# Patient Record
Sex: Female | Born: 1975 | Race: White | Hispanic: No | Marital: Married
Health system: Southern US, Community
[De-identification: ages and names within clinical notes are randomized; demographics above are authoritative.]

---

## 2009-02-18 ENCOUNTER — Ambulatory Visit: Payer: Self-pay | Admitting: Obstetrics & Gynecology

## 2009-02-21 ENCOUNTER — Inpatient Hospital Stay (HOSPITAL_COMMUNITY): Admission: AD | Admit: 2009-02-21 | Discharge: 2009-02-23 | Payer: Self-pay | Admitting: Obstetrics & Gynecology

## 2010-01-21 IMAGING — US US OB US >=[ID] SNGL FETUS
1 series · 17 of 28 positions shown · non-contrast
Comparison: none

REASON FOR EXAM: post dates      Fax Results [phone] While Pt is there
COMMENTS:

PROCEDURE:     US  - US OB GREATER/OR EQUAL TO 0HVTS  - February 18, 2009  [DATE]
RESULT:     Comparison: None
INDICATION: Post dates
TECHNIQUE: Multiple transabdominal gray-scale images of the pelvis
performed.

[Series 1: us ob us >=(id) sngl fetus · 17 of 40 slices shown]
[im 1/40]
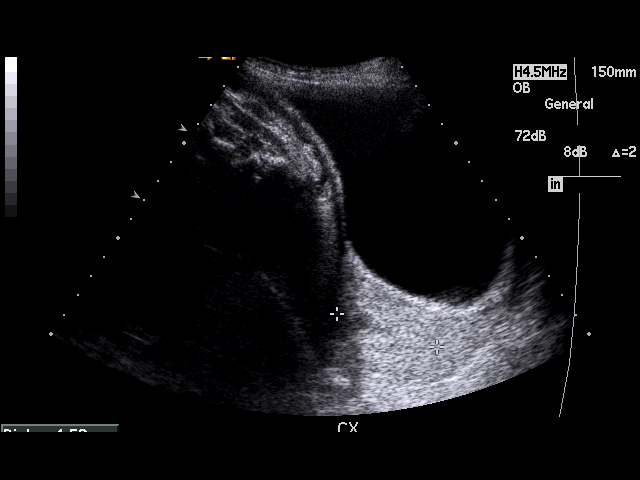
[im 3/40]
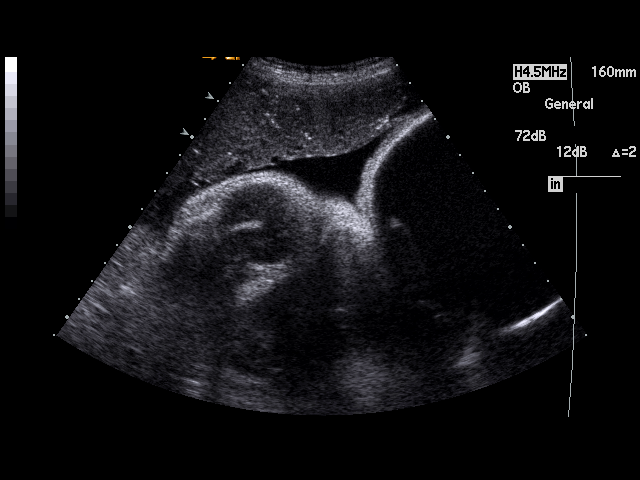
[im 6/40]
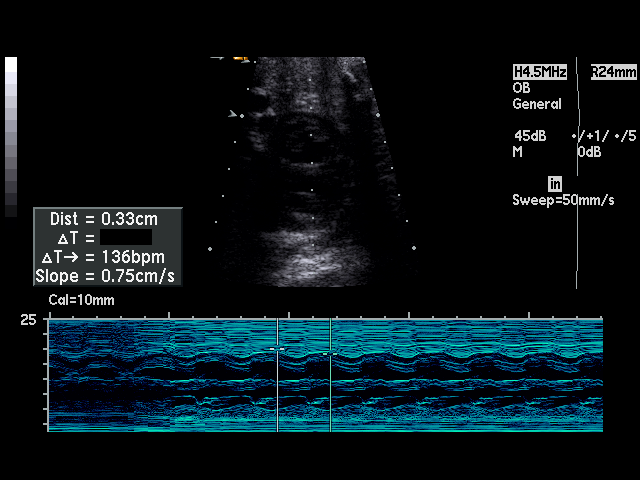
[im 8/40]
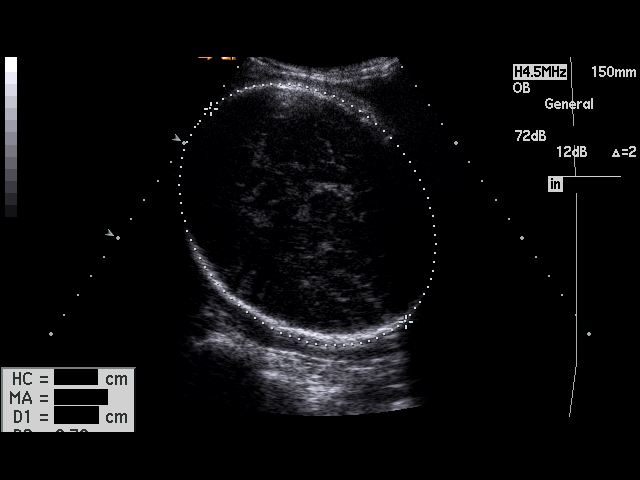
[im 11/40]
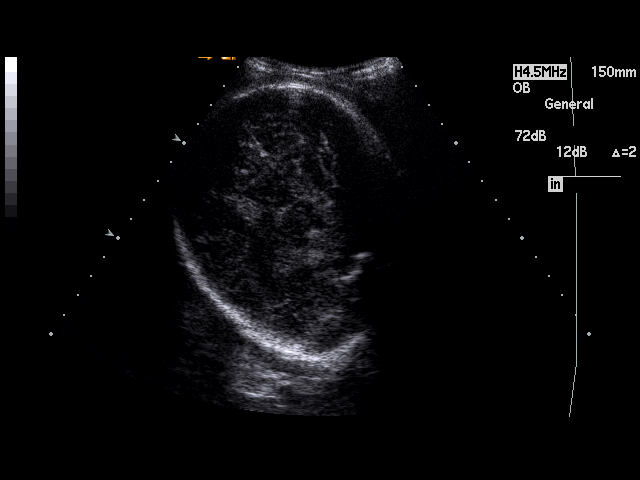
[im 14/40]
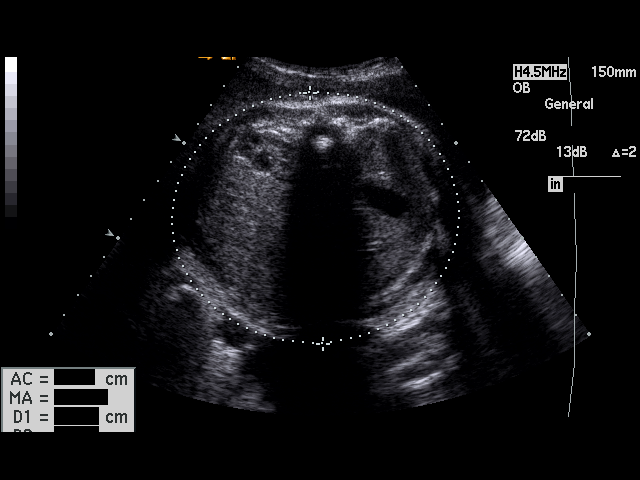
[im 15/40]
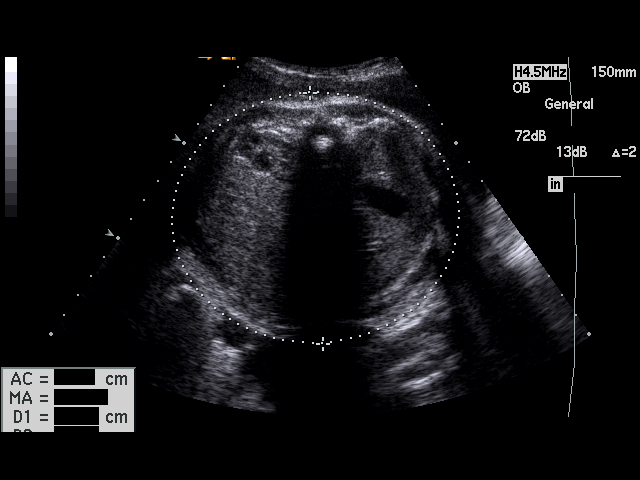
[im 18/40]
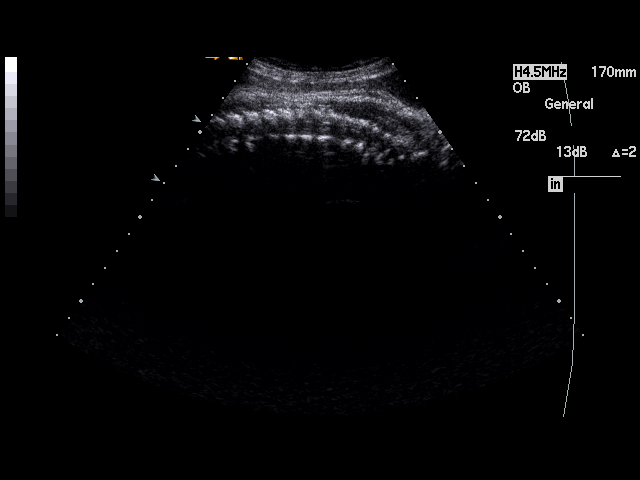
[im 21/40]
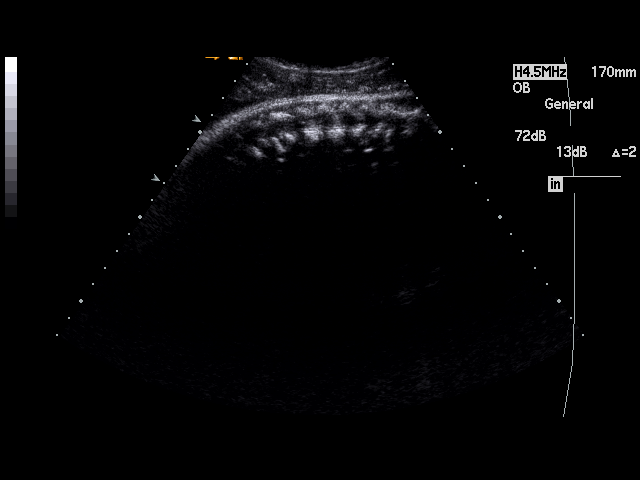
[im 22/40]
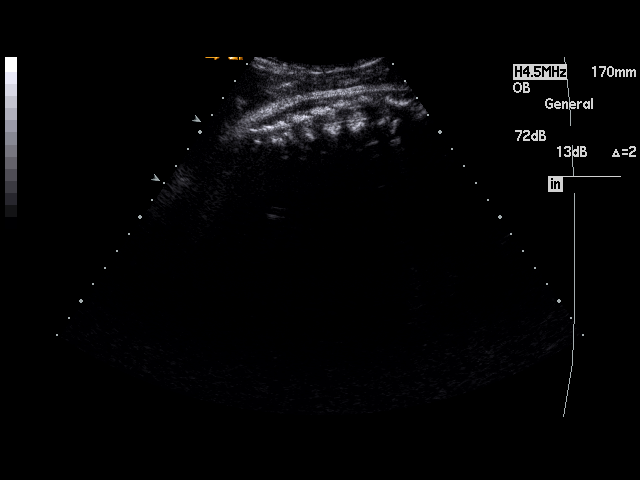
[im 25/40]
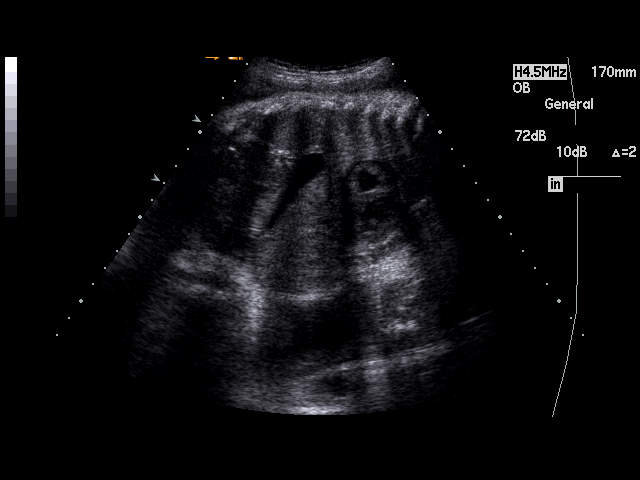
[im 27/40]
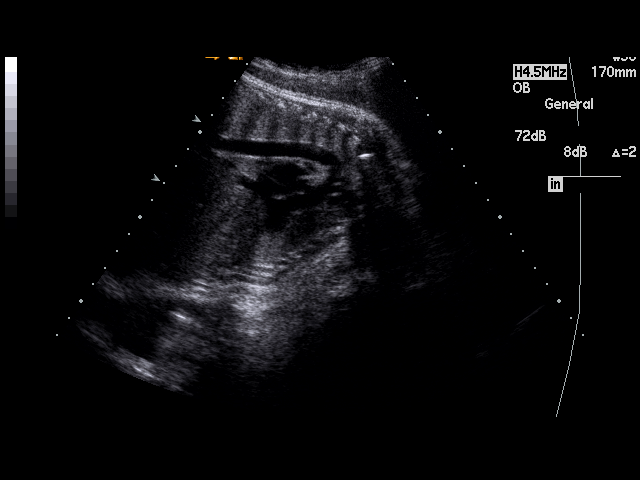
[im 29/40]
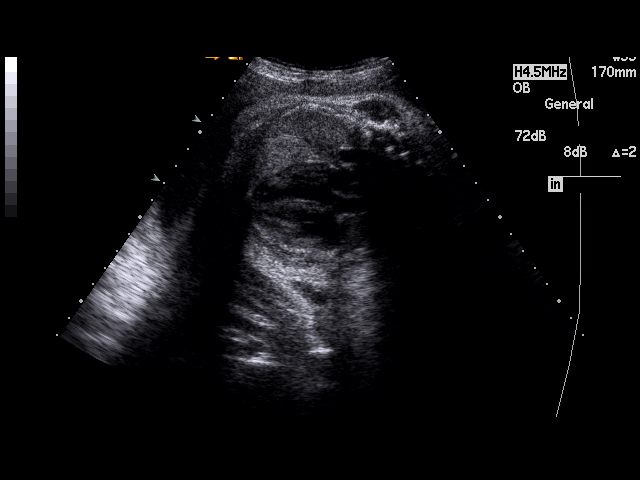
[im 32/40]
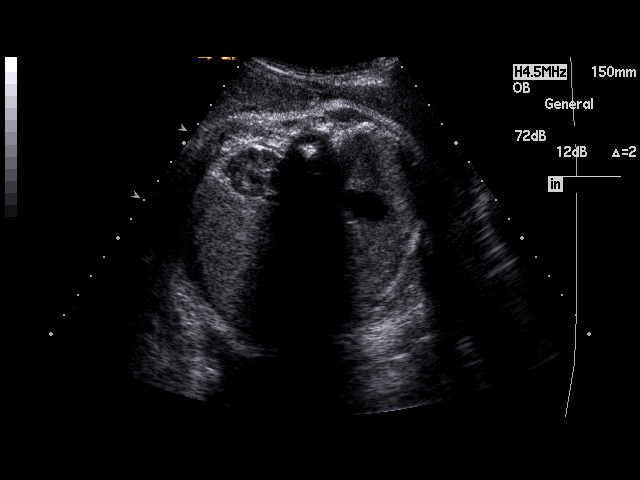
[im 34/40]
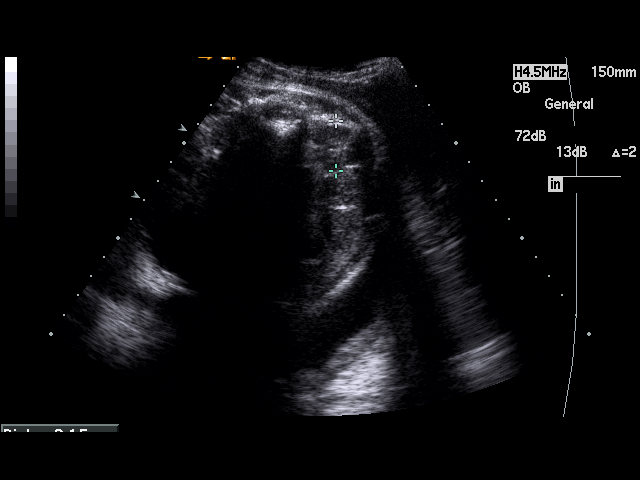
[im 37/40]
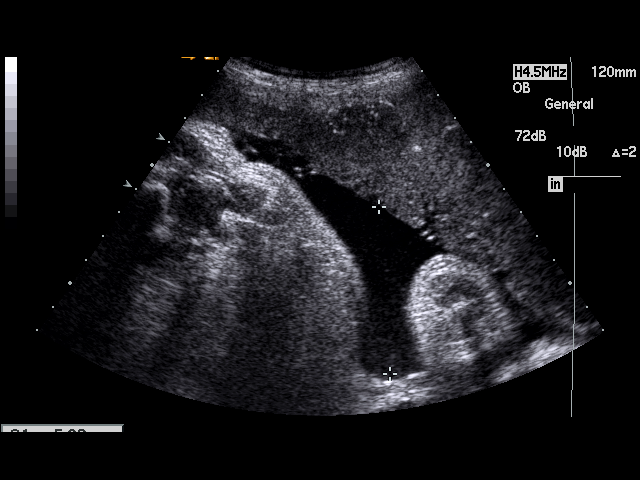
[im 40/40]
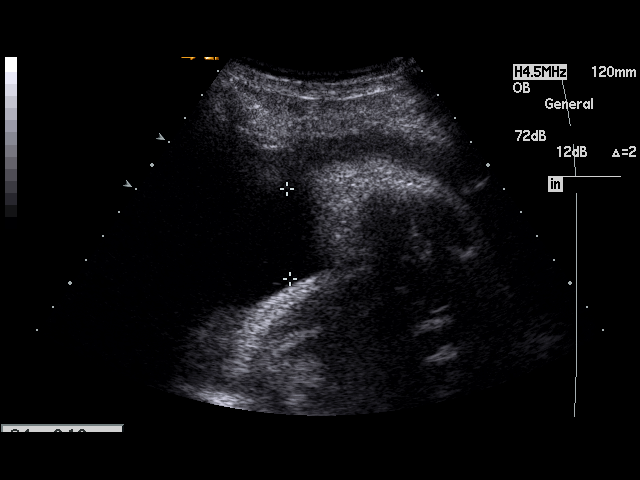

[17 of 28 positions shown; findings below may reference images not displayed]

FINDINGS: There is a single live intrauterine pregnancy dating 40 weeks 4 days. There
is a normal fetal heart rate of 136 beats per minute. The cervix is normal
in length measuring 4.5 cm. The cervix is closed. The estimated fetal weight
is 3237 grams + / - 719 grams corresponding to 8 pounds 14 ounces. The
amniotic fluid index measures 14.1 cm. The placenta is grade II and
anteriorly located. There is no placenta previa or abruption. The fetus is
in cephalic presentation. There is no gross fetal abnormality.

There is no adnexal mass. There is no pelvic free fluid.
IMPRESSION: A single live intrauterine pregnancy dating 40 weeks 1day with an estimated
fetal weight is 3237 grams + / - 719 grams corresponding to 8 pounds 14 ounces.

## 2010-11-16 ENCOUNTER — Other Ambulatory Visit: Payer: Self-pay | Admitting: Adult Health Nurse Practitioner

## 2010-11-16 ENCOUNTER — Ambulatory Visit
Admission: RE | Admit: 2010-11-16 | Discharge: 2010-11-16 | Disposition: A | Discharge status: Home / Self Care | Source: Ambulatory Visit | Attending: Adult Health Nurse Practitioner | Admitting: Adult Health Nurse Practitioner

## 2010-11-16 DIAGNOSIS — R52 Pain, unspecified: Secondary | ICD-10-CM

## 2010-12-06 LAB — CBC
HCT: 34.2 % — ABNORMAL LOW (ref 36.0–46.0)
Hemoglobin: 12.6 g/dL (ref 12.0–15.0)
MCHC: 33.8 g/dL (ref 30.0–36.0)
MCV: 93.6 fL (ref 78.0–100.0)
Platelets: 133 10*3/uL — ABNORMAL LOW (ref 150–400)
RBC: 3.65 MIL/uL — ABNORMAL LOW (ref 3.87–5.11)
RBC: 4.01 MIL/uL (ref 3.87–5.11)
WBC: 12.4 10*3/uL — ABNORMAL HIGH (ref 4.0–10.5)
WBC: 13.3 10*3/uL — ABNORMAL HIGH (ref 4.0–10.5)

## 2010-12-06 LAB — RPR: RPR Ser Ql: NONREACTIVE

## 2011-10-19 IMAGING — CR DG HIP COMPLETE 2+V*R*
2 series · 2 of 2 positions shown · non-contrast
Comparison: None.

CLINICAL DATA: Cannot bear weight on right hip 2 weeks

RIGHT HIP - COMPLETE 2+ VIEW

[view not recorded (1 of 2)]
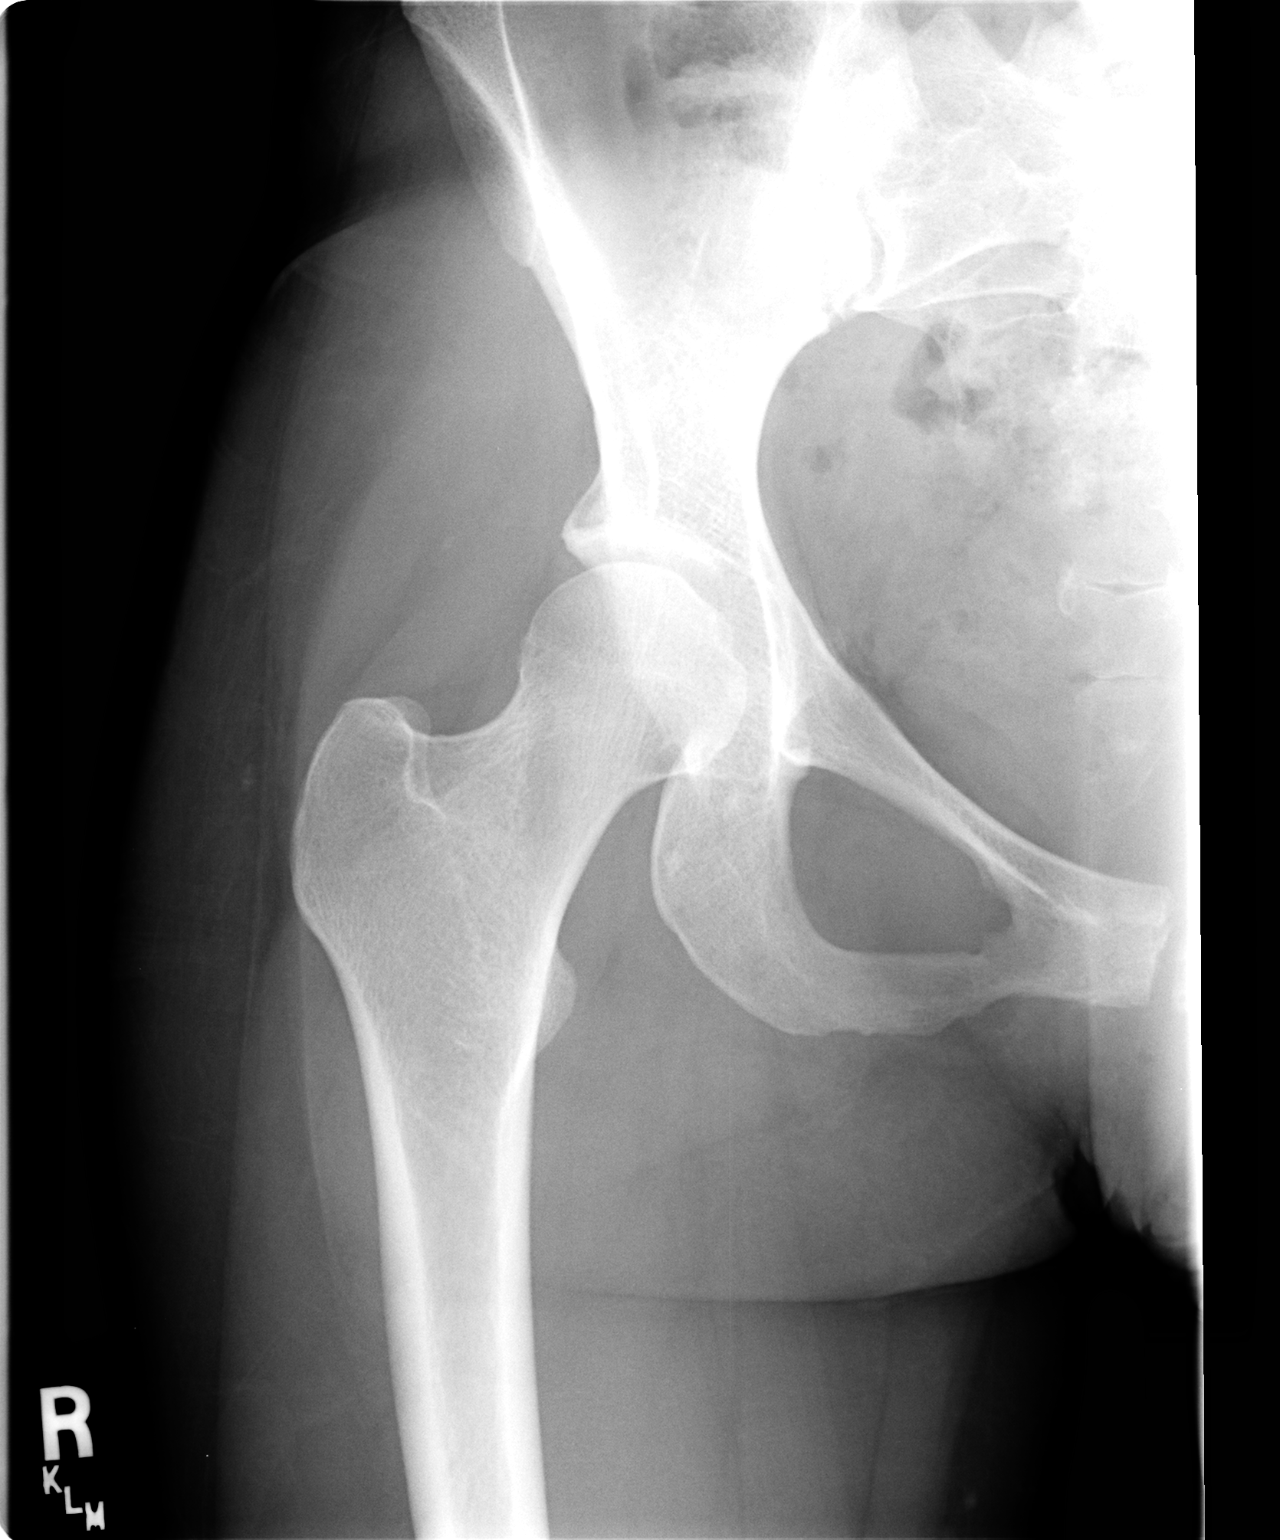

[view not recorded (2 of 2)]
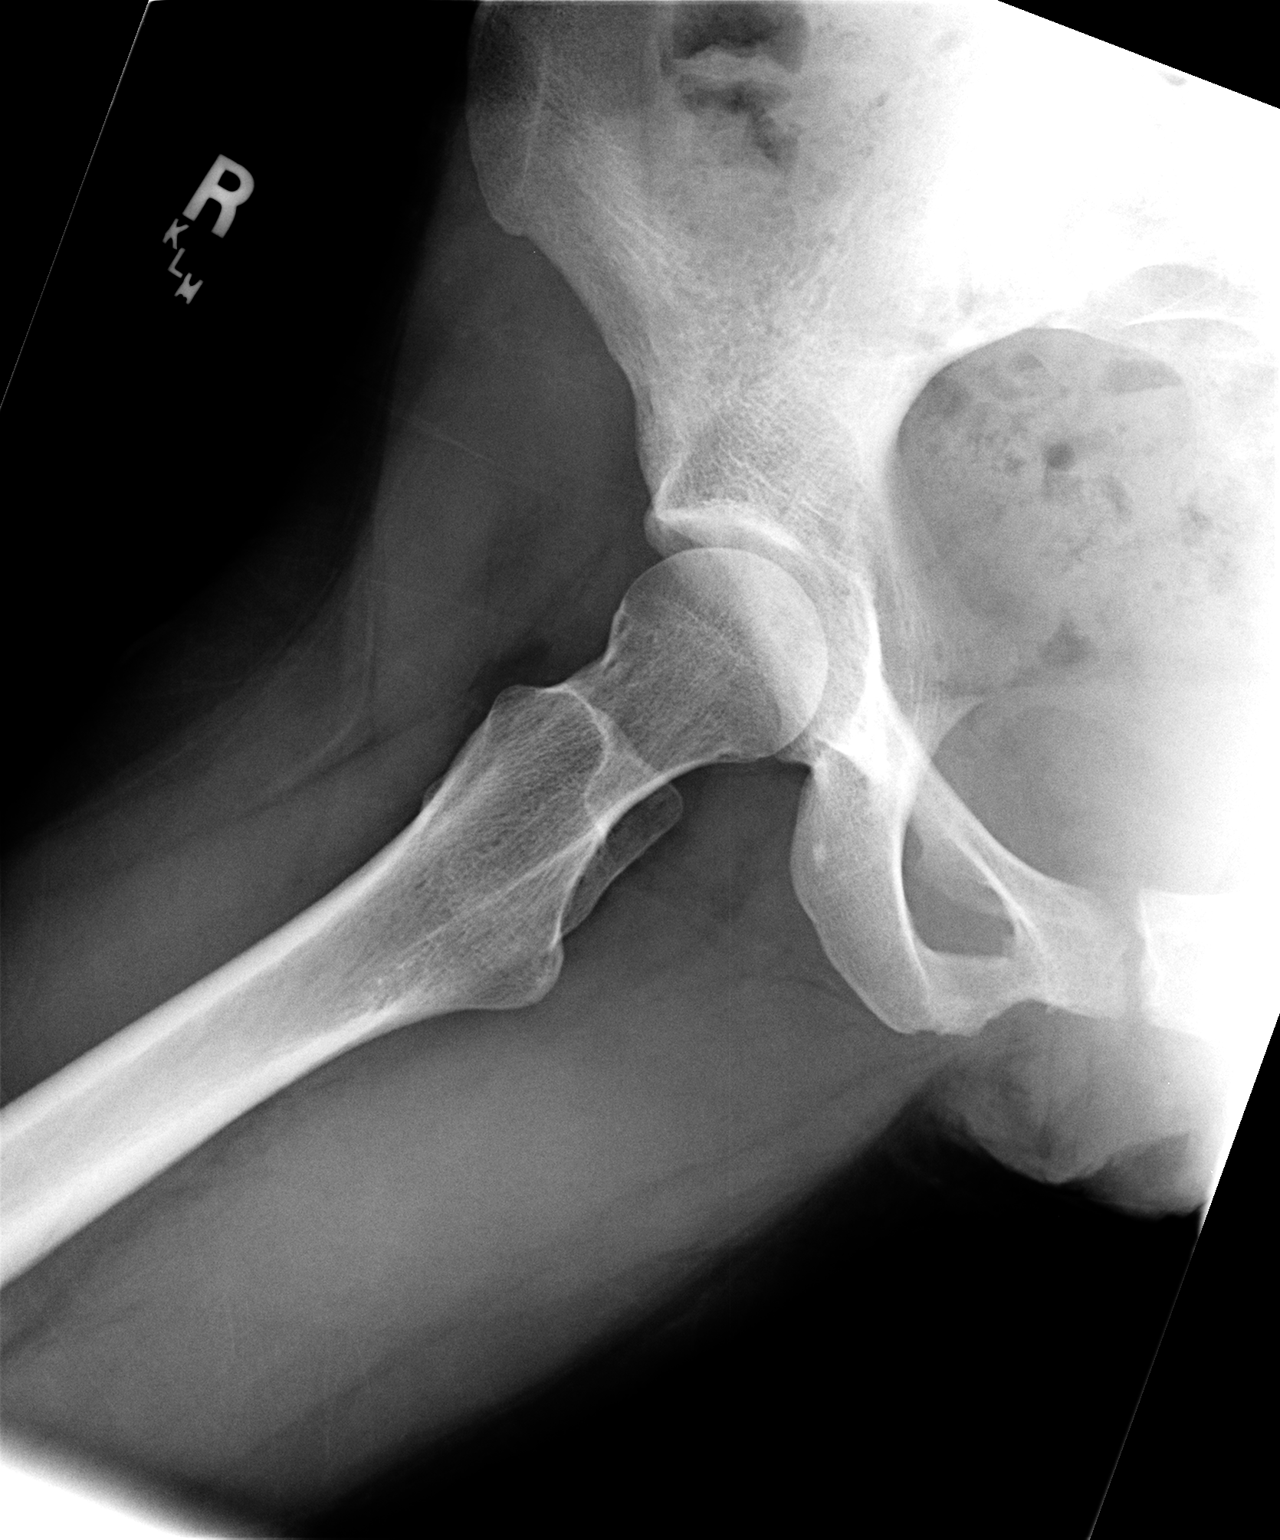

[2 of 2 positions shown; findings below may reference images not displayed]

FINDINGS: The right hip joint space appears normal.  No acute bony
abnormality is seen.  The right ramus is intact and the right SI
joint appears normal.
IMPRESSION: Negative right hip.

## 2012-02-08 ENCOUNTER — Inpatient Hospital Stay: Payer: Self-pay

## 2012-02-08 LAB — CBC WITH DIFFERENTIAL/PLATELET
Basophil #: 0 10*3/uL (ref 0.0–0.1)
Basophil %: 0.2 %
HCT: 39.2 % (ref 35.0–47.0)
Neutrophil %: 83.1 %
RDW: 14.1 % (ref 11.5–14.5)
WBC: 15.1 10*3/uL — ABNORMAL HIGH (ref 3.6–11.0)

## 2012-02-09 LAB — HEMATOCRIT: HCT: 36.7 % (ref 35.0–47.0)

## 2015-01-06 NOTE — H&P (Signed)
L&D Evaluation:  History Expanded:   HPI 39 yo [id number] presentsin active labor.    Blood Type A positive    Group B Strep Results (Result >5wks must be treated as unknown) negative    Maternal HIV Negative    Maternal Syphilis Ab Nonreactive    Maternal Varicella Immune    Rubella Results immune    Presents with contractions    Patient's Medical History No Chronic Illness    Patient's Surgical History none    Medications Pre Natal Vitamins    Allergies NKDA    Social History none   ROS:   ROS All systems were reviewed.  HEENT, CNS, GI, GU, Respiratory, CV, Renal and Musculoskeletal systems were found to be normal.   Exam:   General other    Mental Status clear    Chest clear    Heart normal sinus rhythm    Abdomen gravid, non-tender    Estimated Fetal Weight Average for gestational age    Pelvic u7 cm    FHT normal rate with no decels   Impression:   Impression active labor   Electronic Signatures: Towana Badgerosenow, Meital Riehl J (MD)  (Signed 12-Jun-13 06:18)  Authored: L&D Evaluation   Last Updated: 12-Jun-13 06:18 by Towana Badgerosenow, Mckoy Bhakta J (MD)
